# Patient Record
Sex: Male | Born: 1970 | Race: White | Hispanic: No | Marital: Single
Health system: Southern US, Community
[De-identification: ages and names within clinical notes are randomized; demographics above are authoritative.]

---

## 2003-02-28 ENCOUNTER — Ambulatory Visit (HOSPITAL_COMMUNITY): Admission: RE | Admit: 2003-02-28 | Discharge: 2003-03-01 | Payer: Self-pay | Admitting: Neurosurgery

## 2003-02-28 ENCOUNTER — Encounter: Payer: Self-pay | Admitting: Neurosurgery

## 2015-01-18 ENCOUNTER — Other Ambulatory Visit: Payer: Self-pay | Admitting: Occupational Medicine

## 2015-01-18 ENCOUNTER — Ambulatory Visit: Payer: Self-pay

## 2015-01-18 DIAGNOSIS — M25561 Pain in right knee: Secondary | ICD-10-CM

## 2015-04-23 ENCOUNTER — Telehealth: Payer: Self-pay

## 2015-04-23 ENCOUNTER — Ambulatory Visit: Payer: Worker's Compensation | Admitting: Physical Therapy

## 2015-04-23 NOTE — Telephone Encounter (Signed)
04/22/15 family member called and cancelled PT eval. Stated she received call from [facility] denying PT.

## 2016-02-09 IMAGING — CR DG KNEE COMPLETE 4+V*R*
4 series · 4 of 4 positions shown · non-contrast
Comparison: None.

CLINICAL DATA: Right knee pain. The patient fell and twisted his
knee 01/16/2015. Unable to stand.

EXAM:
RIGHT KNEE - COMPLETE 4+ VIEW

[view not recorded (1 of 4)]
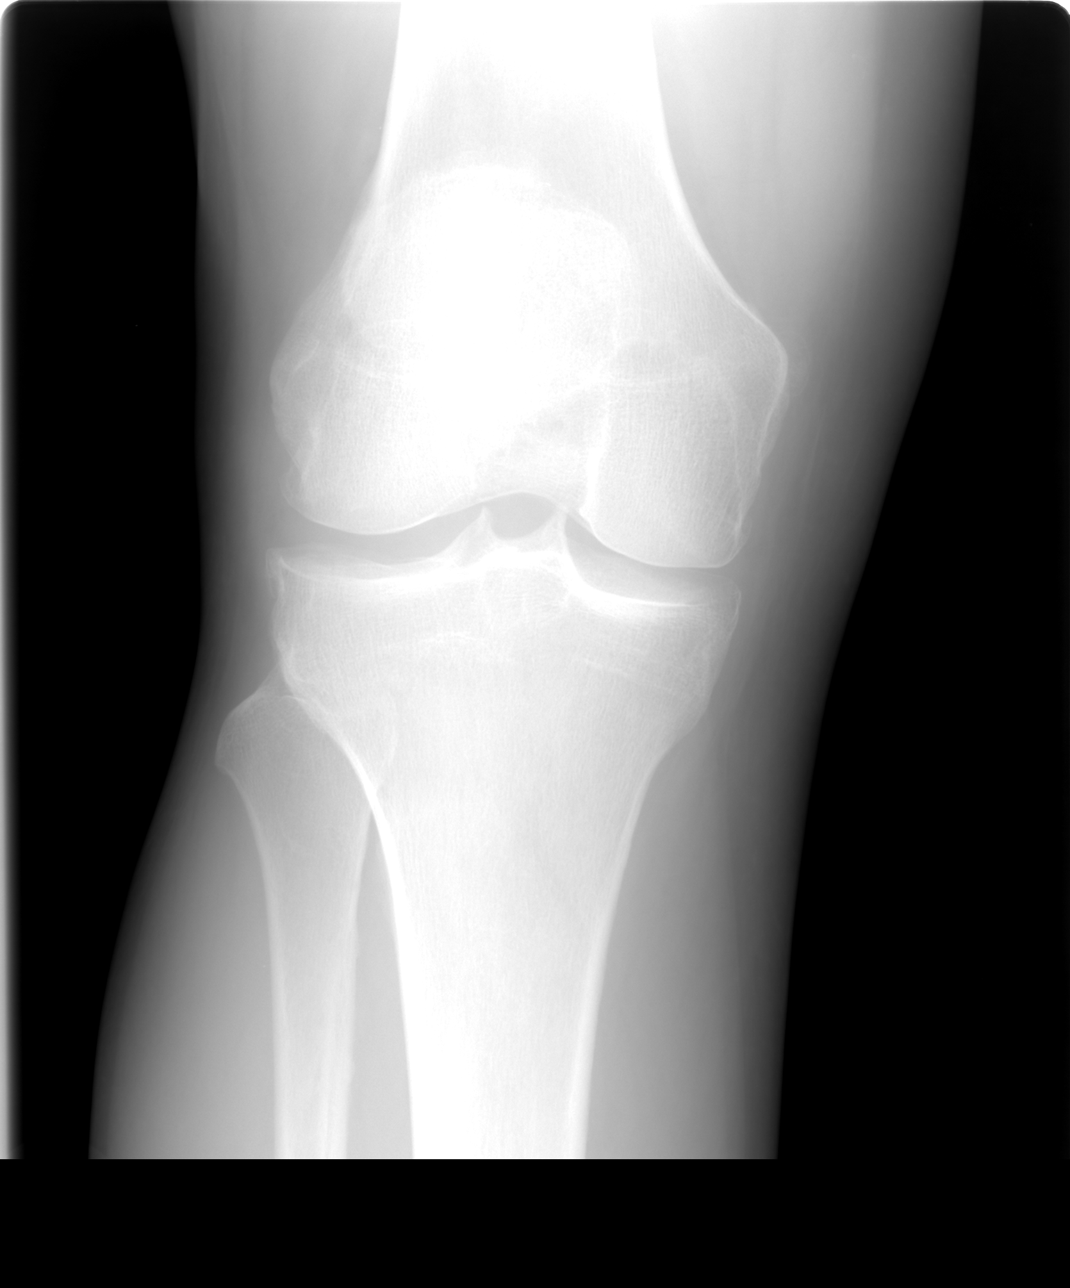

[view not recorded (2 of 4)]
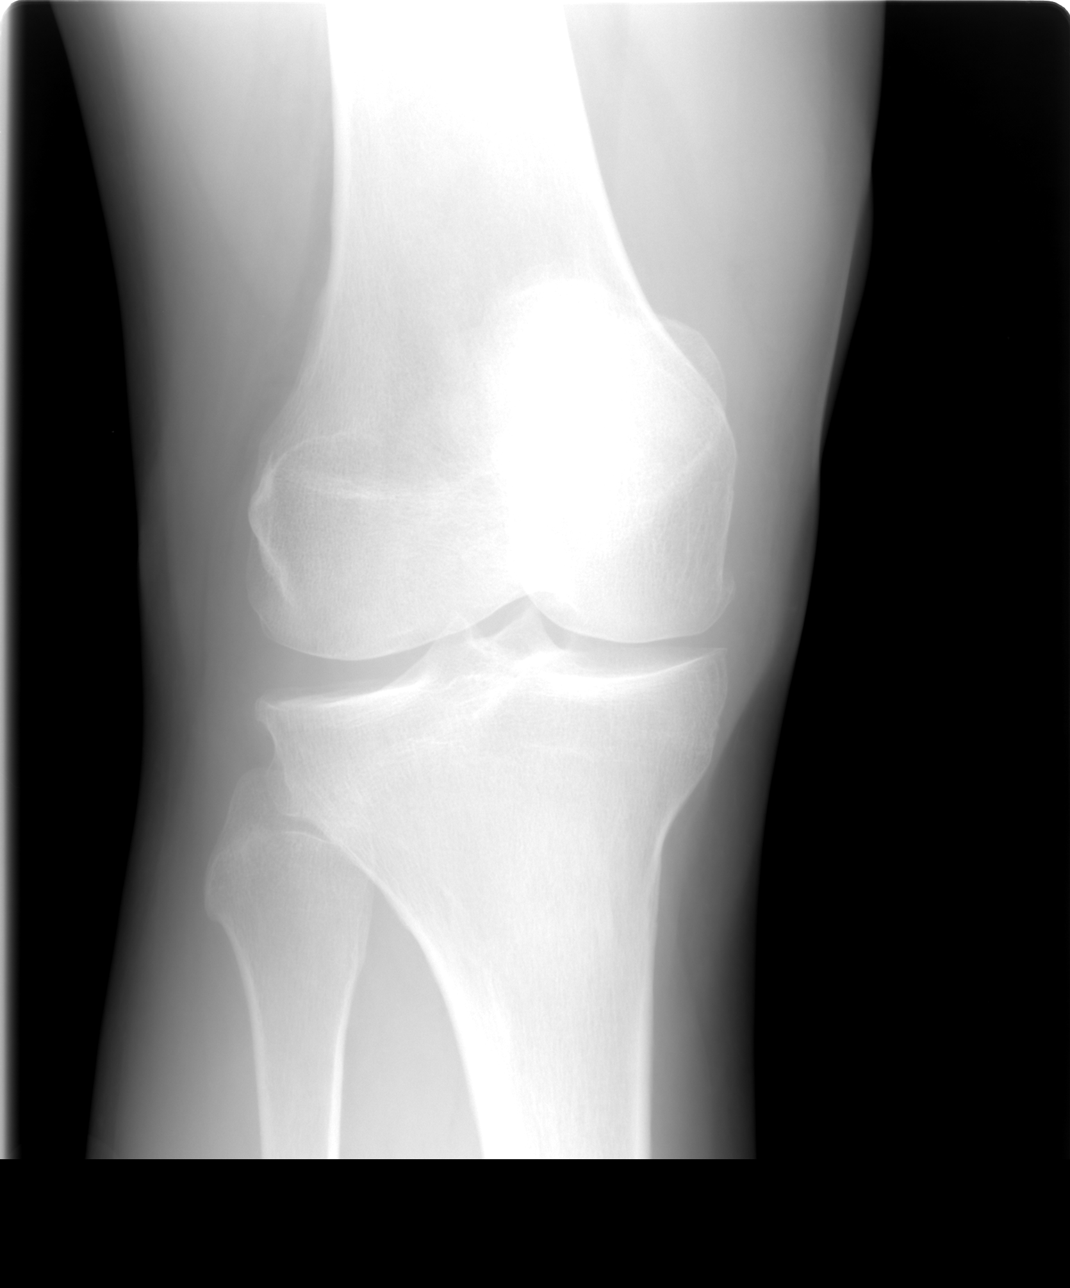

[view not recorded (3 of 4)]
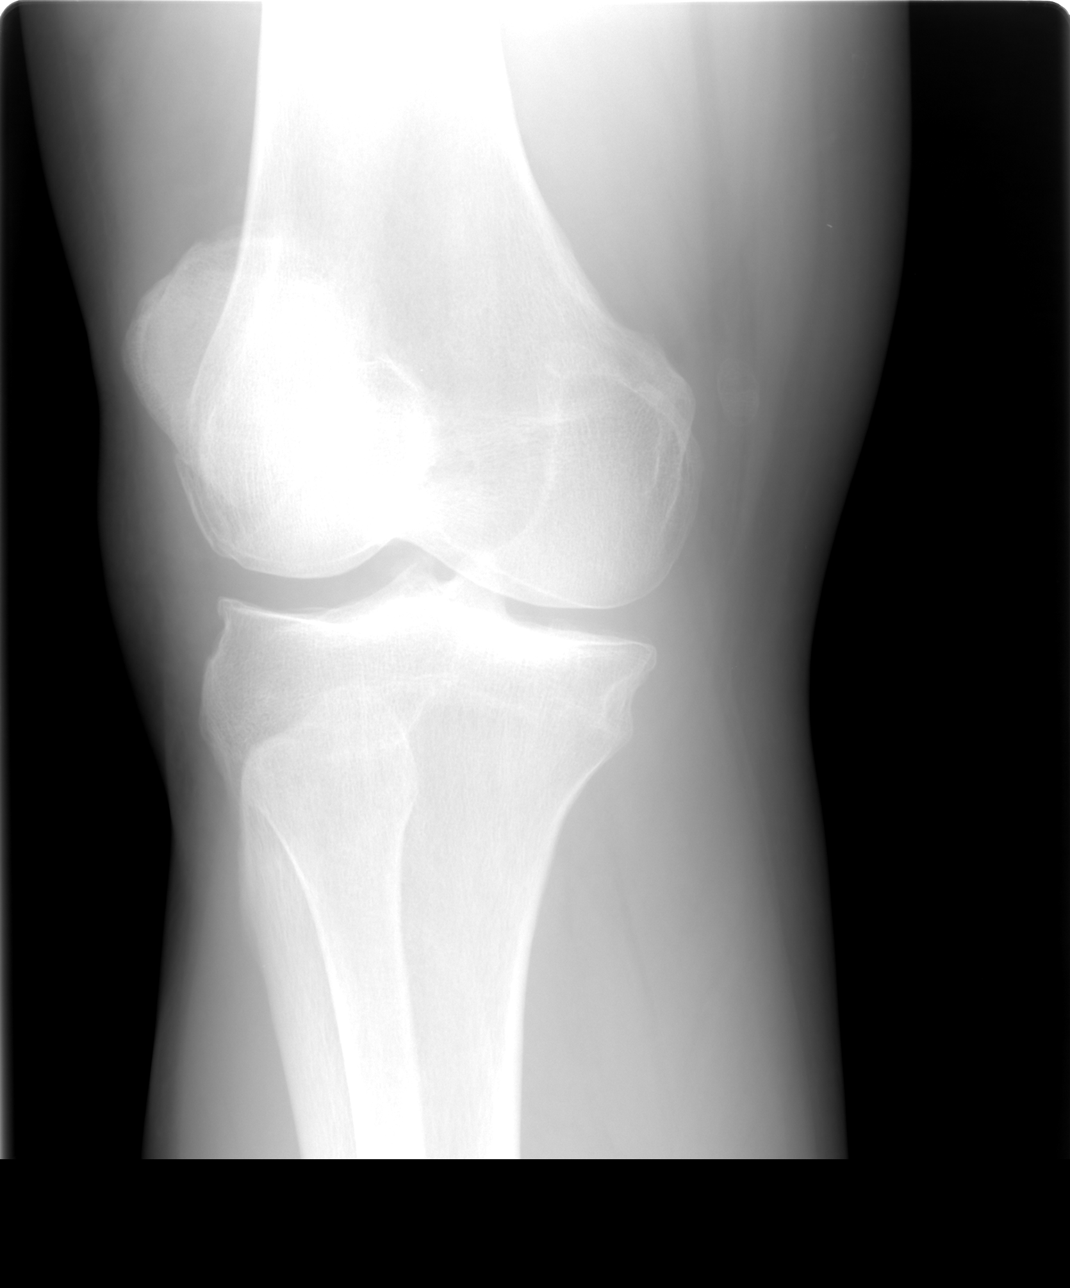

[view not recorded (4 of 4)]
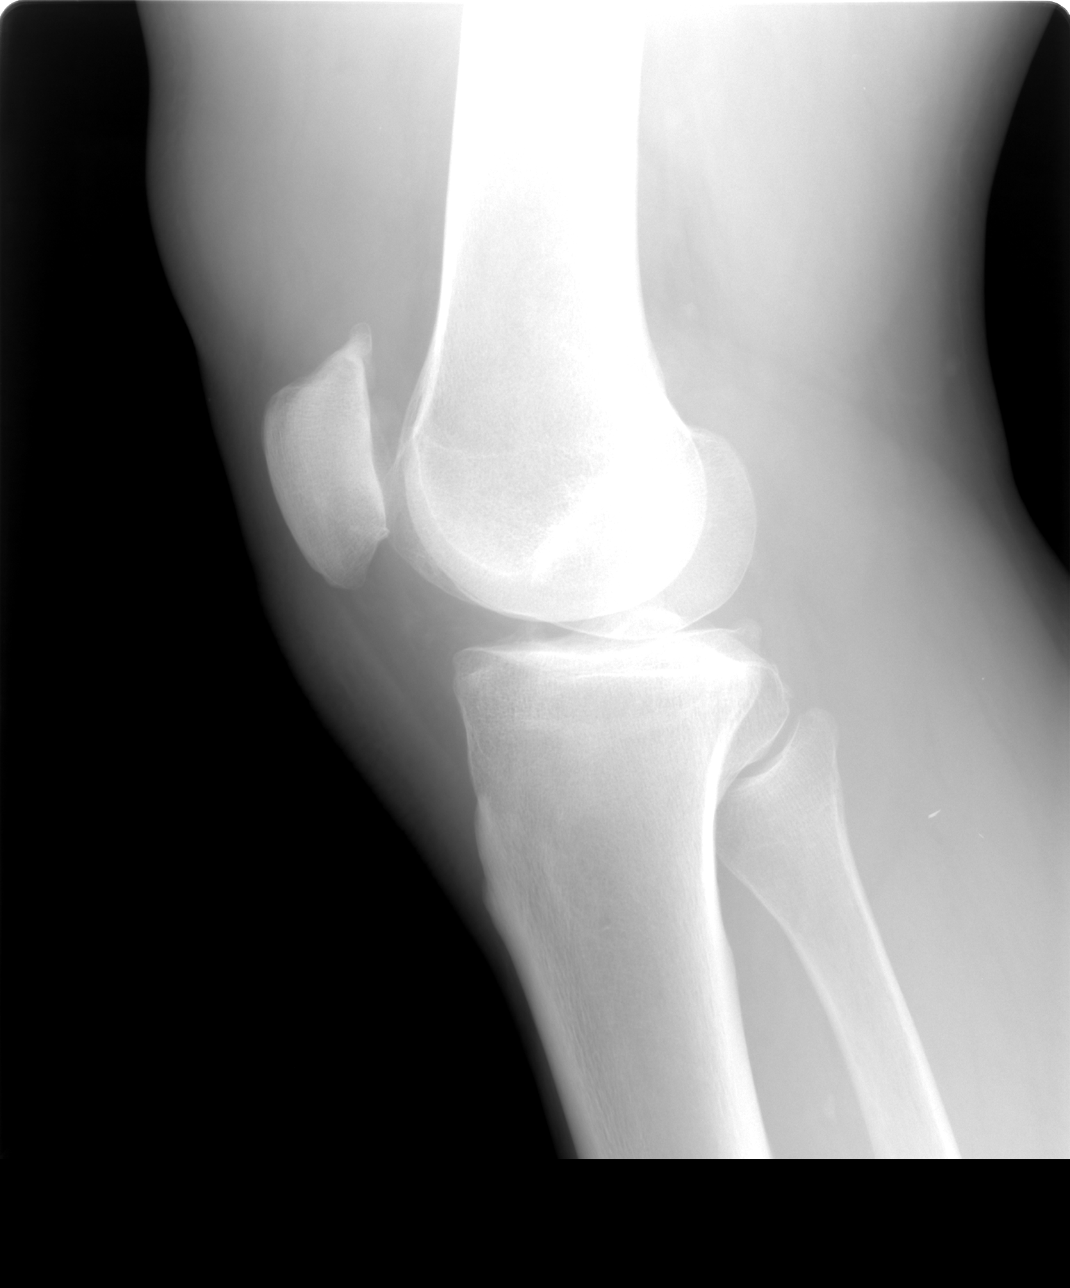

[4 of 4 positions shown; findings below may reference images not displayed]

FINDINGS: The knee is located. A large joint effusion is present. There is no
fracture. No foreign body is present.
IMPRESSION: Large joint effusion without underlying fracture. Internal
derangement is suspected.
